# Patient Record
Sex: Male | Born: 1990 | Race: White | Hispanic: No | Marital: Single | Smoking: Current some day smoker
Health system: Southern US, Community
[De-identification: ages and names within clinical notes are randomized; demographics above are authoritative.]

## PROBLEM LIST (undated history)

## (undated) DIAGNOSIS — F909 Attention-deficit hyperactivity disorder, unspecified type: Secondary | ICD-10-CM

## (undated) DIAGNOSIS — F1920 Other psychoactive substance dependence, uncomplicated: Secondary | ICD-10-CM

---
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
## 2013-04-24 ENCOUNTER — Emergency Department (INDEPENDENT_AMBULATORY_CARE_PROVIDER_SITE_OTHER)
Admission: EM | Admit: 2013-04-24 | Discharge: 2013-04-24 | Disposition: A | Discharge status: Home / Self Care | Payer: Medicaid Other | Source: Home / Self Care | Attending: Emergency Medicine | Admitting: Emergency Medicine

## 2013-04-24 ENCOUNTER — Encounter (HOSPITAL_COMMUNITY): Payer: Self-pay | Admitting: Emergency Medicine

## 2013-04-24 DIAGNOSIS — J069 Acute upper respiratory infection, unspecified: Secondary | ICD-10-CM

## 2013-04-24 MED ORDER — TRAMADOL HCL 50 MG PO TABS: ORAL_TABLET | ORAL | Status: DC

## 2013-04-24 MED ORDER — ONDANSETRON 8 MG PO TBDP: 8.0000 mg | ORAL_TABLET | Freq: Three times a day (TID) | ORAL | Status: DC | PRN

## 2013-04-24 NOTE — Discharge Instructions (Signed)
Most upper respiratory infections are caused by viruses and do not require antibiotics.  We try to save the antibiotics for when we really need them to prevent bacteria from developing resistance to them.  Here are a few hints about things that can be done at home to help get over an upper respiratory infection quicker: ° °Get extra sleep and extra fluids.  Get 7 to 9 hours of sleep per night and 6 to 8 glasses of water a day.  Getting extra sleep keeps the immune system from getting run down.  Most people with an upper respiratory infection are a little dehydrated.  The extra fluids also keep the secretions liquified and easier to deal with.  Also, get extra vitamin C.  4000 mg per day is the recommended dose. °For the aches, headache, and fever, acetaminophen or ibuprofen are helpful.  These can be alternated every 4 hours.  People with liver disease should avoid large amounts of acetaminophen, and people with ulcer disease, gastroesophageal reflux, gastritis, congestive heart failure, chronic kidney disease, coronary artery disease and the elderly should avoid ibuprofen. °For nasal congestion try Mucinex-D, or if you're having lots of sneezing or clear nasal drainage use Zyrtec-D. People with high blood pressure can take these if their blood pressure is controlled, if not, it's best to avoid the forms with a "D" (decongestants).  You can use the plain Mucinex, Allegra, Claritin, or Zyrtec even if your blood pressure is not controlled.   °A Saline nasal spray such as Ocean Spray can also help.  You can add a decongestant sprays such as Afrin, but you should not use the decongestant sprays for more than 3 or 4 days since they can be habituating.  Breathe Rite nasal strips can also offer a non-drug alternative treatment to nasal congestion, especially at night. °For people with symptoms of sinusitis, sleeping with your head elevated can be helpful.  For sinus pain, moist, hot compresses to the face may provide some  relief.  Many people find that inhaling steam as in a shower or from a pot of steaming water can help. °For any viral infection, zinc containing lozenges such as Cold-Eze or Zicam are helpful.  Zinc helps to fight viral infection.  Hot salt water gargles (8 oz of hot water, 1/2 tsp of table salt, and a pinch of baking soda) can give relief as well as hot beverages such as hot tea.  Sucrets extra strength lozenges will help the sore throat.  °For the cough, take Delsym 2 tsp every 12 hours.  It has also been found recently that Aleve can help control a cough.  The dose is 1 to 2 tablets twice daily with food.  This can be combined with Delsym. (Note, if you are taking ibuprofen, you should not take Aleve as well--take one or the other.) °A cool mist vaporizer will help keep your mucous membranes from drying out.  ° °It's important when you have an upper respiratory infection not to pass the infection to others.  This involves being very careful about the following: ° °Frequent hand washing or use of hand sanitizer, especially after coughing, sneezing, blowing your nose or touching your face, nose or eyes. °Do not shake hands or touch anyone and try to avoid touching surfaces that other people use such as doorknobs, shopping carts, telephones and computer keyboards. °Use tissues and dispose of them properly in a garbage can or ziplock bag. °Cough into your sleeve. °Do not let others eat or   drink after you. ° °It's also important to recognize the signs of serious illness and get evaluated if they occur: °Any respiratory infection that lasts more than 7 to 10 days.  Yellow nasal drainage and sputum are not reliable indicators of a bacterial infection, but if they last for more than 1 week, see your doctor. °Fever and sore throat can indicate strep. °Fever and cough can indicate influenza or pneumonia. °Any kind of severe symptom such as difficulty breathing, intractable vomiting, or severe pain should prompt you to see  a doctor as soon as possible. ° ° °Your body's immune system is really the thing that will get rid of this infection.  Your immune system is comprised of 2 types of specialized cells called T cells and B cells.  T cells coordinate the array of cells in your body that engulf invading bacteria or viruses while B cells orchestrate the production of antibodies that neutralize infection.  Anything we do or any medications we give you, will just strengthen your immune system or help it clear up the infection quicker.  Here are a few helpful hints to improve your immune system to help overcome this illness or to prevent future infections: °· A few vitamins can improve the health of your immune system.  That's why your diet should include plenty of fruits, vegetables, fish, nuts, and whole grains. °· Vitamin A and bet-carotene can increase the cells that fight infections (T cells and B cells).  Vitamin A is abundant in dark greens and orange vegetables such as spinach, greens, sweet potatoes, and carrots. °· Vitamin B6 contributes to the maturation of white blood cells, the cells that fight disease.  Foods with vitamin B6 include cold cereal and bananas. °· Vitamin C is credited with preventing colds because it increases white blood cells and also prevents cellular damage.  Citrus fruits, peaches and green and red bell peppers are all hight in vitamin C. °· Vitamin E is an anti-oxidant that encourages the production of natural killer cells which reject foreign invaders and B cells that produce antibodies.  Foods high in vitamin E include wheat germ, nuts and seeds. °· Foods high in omega-3 fatty acids found in foods like salmon, tuna and mackerel boost your immune system and help cells to engulf and absorb germs. °· Probiotics are good bacteria that increase your T cells.  These can be found in yogurt and are available in supplements such as Culturelle or Align. °· Moderate exercise increases the strength of your immune  system and your ability to recover from illness.  I suggest 3 to 5 moderate intensity 30 minute workouts per week.   °· Sleep is another component of maintaining a strong immune system.  It enables your body to recuperate from the day's activities, stress and work.  My recommendation is to get between 7 and 9 hours of sleep per night. °· If you smoke, try to quit completely or at least cut down.  Drink alcohol only in moderation if at all.  No more than 2 drinks daily for men or 1 for women. °· Get a flu vaccine early in the fall or if you have not gotten one yet, once this illness has run its course.  If you are over 65, a smoker, or an asthmatic, get a pneumococcal vaccine. °· My final recommendation is to maintain a healthy weight.  Excess weight can impair the immune system by interfering with the way the immune system deals with invading viruses or   bacteria.    Cool Mist Vaporizers Vaporizers may help relieve the symptoms of a cough and cold. They add moisture to the air, which helps mucus to become thinner and less sticky. This makes it easier to breathe and cough up secretions. Cool mist vaporizers do not cause serious burns like hot mist vaporizers ("steamers, humidifiers"). Vaporizers have not been proved to show they help with colds. You should not use a vaporizer if you are allergic to mold.  HOME CARE INSTRUCTIONS  Follow the package instructions for the vaporizer.  Do not use anything other than distilled water in the vaporizer.  Do not run the vaporizer all of the time. This can cause mold or bacteria to grow in the vaporizer.  Clean the vaporizer after each time it is used.  Clean and dry the vaporizer well before storing it.  Stop using the vaporizer if worsening respiratory symptoms develop. Document Released: 11/25/2003 Document Revised: 10/30/2012 Document Reviewed: 07/17/2012 [facility] Patient Information 2014 [place], [place].

## 2013-04-24 NOTE — ED Provider Notes (Signed)
  Chief Complaint   Chief Complaint  Patient presents with  . Cough    History of Present Illness   Ronald Barajas is a 23 year old male who has had a three-day history of a cough productive of white sputum, aching in chest, feeling weak and tired. He's felt nauseated and vomited several times. He's had sore throat, and rhinorrhea. He denies any fever, chills, nasal congestion, headache, wheezing, stomach pain, or diarrhea. He has had no specific sick exposures. He's been taking Alka-Seltzer and NyQuil.  Review of Systems   Other than as noted above, the patient denies any of the following symptoms: Systemic:  No fevers, chills, sweats, or myalgias. Eye:  No redness or discharge. ENT:  No ear pain, headache, nasal congestion, drainage, sinus pressure, or sore throat. Neck:  No neck pain, stiffness, or swollen glands. Lungs:  No cough, sputum production, hemoptysis, wheezing, chest tightness, shortness of breath or chest pain. GI:  No abdominal pain, nausea, vomiting or diarrhea.  PMFSH   Past medical history, family history, social history, meds, and allergies were reviewed.   Physical exam   Vital signs:  BP 138/69  Pulse 82  Temp(Src) 98.2 F (36.8 C) (Oral)  Resp 18  SpO2 100% General:  Alert and oriented.  In no distress.  Skin warm and dry. Eye:  No conjunctival injection or drainage. Lids were normal. ENT:  TMs and canals were normal, without erythema or inflammation.  Nasal mucosa was clear and uncongested, without drainage.  Mucous membranes were moist.  Pharynx was clear with no exudate or drainage.  There were no oral ulcerations or lesions. Neck:  Supple, no adenopathy, tenderness or mass. Lungs:  No respiratory distress.  Lungs were clear to auscultation, without wheezes, rales or rhonchi.  Breath sounds were clear and equal bilaterally.  Heart:  Regular rhythm, without gallops, murmers or rubs. Skin:  Clear, warm, and dry, without rash or lesions.   Assessment      The encounter diagnosis was Viral upper respiratory infection.  Plan    1.  Meds:  The following meds were prescribed:   Discharge Medication List as of 04/24/2013  4:15 PM    START taking these medications   Details  ondansetron (ZOFRAN ODT) 8 MG disintegrating tablet Take 1 tablet (8 mg total) by mouth every 8 (eight) hours as needed for nausea., Starting 04/24/2013, Until Discontinued, Normal    traMADol (ULTRAM) 50 MG tablet 1 to 2 every 8 hours as needed for cough, Normal        2.  Patient Education/Counseling:  The patient was given appropriate handouts, self care instructions, and instructed in symptomatic relief.  Instructed to get extra fluids, rest, and use a cool mist vaporizer.    3.  Follow up:  The patient was told to follow up here if no better in 3 to 4 days, or sooner if becoming worse in any way, and given some red flag symptoms such as increasing fever, difficulty breathing, chest pain, or persistent vomiting which would prompt immediate return.  Follow up here as needed.      Reuben Likesavid C Tanvir Hipple, MD 04/24/13 Rickey [id number]

## 2013-04-24 NOTE — ED Notes (Signed)
Cough  Weak  Nauseated  X  3  Days   Pt      denys  Any  Other  Other  symptms

## 2013-05-15 ENCOUNTER — Encounter (HOSPITAL_COMMUNITY): Payer: Self-pay | Admitting: Emergency Medicine

## 2013-05-15 ENCOUNTER — Emergency Department (HOSPITAL_COMMUNITY)
Admission: EM | Admit: 2013-05-15 | Discharge: 2013-05-16 | Disposition: A | Discharge status: Home / Self Care | Payer: Medicaid Other | Attending: Emergency Medicine | Admitting: Emergency Medicine

## 2013-05-15 DIAGNOSIS — F17200 Nicotine dependence, unspecified, uncomplicated: Secondary | ICD-10-CM | POA: Insufficient documentation

## 2013-05-15 DIAGNOSIS — R05 Cough: Secondary | ICD-10-CM | POA: Insufficient documentation

## 2013-05-15 DIAGNOSIS — R748 Abnormal levels of other serum enzymes: Secondary | ICD-10-CM | POA: Insufficient documentation

## 2013-05-15 DIAGNOSIS — Z791 Long term (current) use of non-steroidal anti-inflammatories (NSAID): Secondary | ICD-10-CM | POA: Insufficient documentation

## 2013-05-15 DIAGNOSIS — Z79899 Other long term (current) drug therapy: Secondary | ICD-10-CM | POA: Insufficient documentation

## 2013-05-15 DIAGNOSIS — R059 Cough, unspecified: Secondary | ICD-10-CM | POA: Insufficient documentation

## 2013-05-15 DIAGNOSIS — R091 Pleurisy: Secondary | ICD-10-CM | POA: Insufficient documentation

## 2013-05-15 DIAGNOSIS — Z8659 Personal history of other mental and behavioral disorders: Secondary | ICD-10-CM | POA: Insufficient documentation

## 2013-05-15 NOTE — ED Notes (Signed)
Pleurisy: inc. Pain with abduction, deep inspiration. Non productive cough.

## 2013-05-16 ENCOUNTER — Emergency Department (HOSPITAL_COMMUNITY): Payer: Medicaid Other

## 2013-05-16 LAB — COMPREHENSIVE METABOLIC PANEL
ALBUMIN: 4.5 g/dL (ref 3.5–5.2)
ALK PHOS: 81 U/L (ref 39–117)
ALT: 694 U/L — ABNORMAL HIGH (ref 0–53)
AST: 310 U/L — AB (ref 0–37)
BUN: 9 mg/dL (ref 6–23)
CHLORIDE: 102 meq/L (ref 96–112)
CO2: 27 mEq/L (ref 19–32)
Calcium: 9.6 mg/dL (ref 8.4–10.5)
Creatinine, Ser: 0.86 mg/dL (ref 0.50–1.35)
GFR calc Af Amer: >90 mL/min (ref >90)
GFR calc non Af Amer: >90 mL/min (ref >90)
Glucose, Bld: 93 mg/dL (ref 70–99)
POTASSIUM: 4.3 meq/L (ref 3.7–5.3)
SODIUM: 141 meq/L (ref 137–147)
Total Bilirubin: 0.5 mg/dL (ref 0.3–1.2)
Total Protein: 7 g/dL (ref 6.0–8.3)

## 2013-05-16 LAB — CBC WITH DIFFERENTIAL/PLATELET
BASOS ABS: 0 10*3/uL (ref 0.0–0.1)
BASOS PCT: 0 % (ref 0–1)
Eosinophils Absolute: 0 10*3/uL (ref 0.0–0.7)
Eosinophils Relative: 1 % (ref 0–5)
HCT: 43.7 % (ref 39.0–52.0)
HEMOGLOBIN: 15.7 g/dL (ref 13.0–17.0)
Lymphocytes Relative: 34 % (ref 12–46)
Lymphs Abs: 2 10*3/uL (ref 0.7–4.0)
MCH: 32.4 pg (ref 26.0–34.0)
MCHC: 35.9 g/dL (ref 30.0–36.0)
MCV: 90.1 fL (ref 78.0–100.0)
MONOS PCT: 9 % (ref 3–12)
Monocytes Absolute: 0.5 10*3/uL (ref 0.1–1.0)
NEUTROS ABS: 3.2 10*3/uL (ref 1.7–7.7)
NEUTROS PCT: 56 % (ref 43–77)
PLATELETS: 196 10*3/uL (ref 150–400)
RBC: 4.85 MIL/uL (ref 4.22–5.81)
RDW: 12.4 % (ref 11.5–15.5)
WBC: 5.7 10*3/uL (ref 4.0–10.5)

## 2013-05-16 LAB — I-STAT TROPONIN, ED: Troponin i, poc: 0 ng/mL (ref 0.00–0.08)

## 2013-05-16 LAB — D-DIMER, QUANTITATIVE: D-Dimer, Quant: <0.27 ug/mL-FEU (ref 0.00–0.48)

## 2013-05-16 MED ORDER — KETOROLAC TROMETHAMINE 60 MG/2ML IM SOLN
60.0000 mg | Freq: Once | INTRAMUSCULAR | Status: AC
  Administered 2013-05-16: 60 mg via INTRAMUSCULAR
  Filled 2013-05-16: qty 2

## 2013-05-16 MED ORDER — IBUPROFEN 600 MG PO TABS: 600.0000 mg | ORAL_TABLET | Freq: Three times a day (TID) | ORAL | Status: AC

## 2013-05-16 NOTE — ED Notes (Signed)
Observing patient for 20 minutes to ensure pt does not have a medication reaction

## 2013-05-16 NOTE — Discharge Instructions (Signed)
Take medications as prescribed.: Make an appointment to followup with gastroenterologist regarding your liver abnormalities. Return to emergency department for worsening pain or any concerns.  Pleurisy Pleurisy is an inflammation and swelling of the lining of the lungs (pleura). Because of this inflammation, it hurts to breathe. It can be aggravated by coughing, laughing, or deep breathing. Pleurisy is often caused by an underlying infection or disease.  HOME CARE INSTRUCTIONS  Monitor your pleurisy for any changes. The following actions may help to alleviate any discomfort you are experiencing:  Medicine may help with pain. Only take over-the-counter or prescription medicines for pain, discomfort, or fever as directed by your health care provider.  Only take antibiotic medicine as directed. Make sure to finish it even if you start to feel better. SEEK MEDICAL CARE IF:   Your pain is not controlled with medicine or is increasing.  You have an increase in pus-like (purulent) secretions brought up with coughing. SEEK IMMEDIATE MEDICAL CARE IF:   You have blue or dark lips, fingernails, or toenails.  You are coughing up blood.  You have increased difficulty breathing.  You have continuing pain unrelieved by medicine or pain lasting more than 1 week.  You have pain that radiates into your neck, arms, or jaw.  You develop increased shortness of breath or wheezing.  You develop a fever, rash, vomiting, fainting, or other serious symptoms. MAKE SURE YOU:  Understand these instructions.   Will watch your condition.   Will get help right away if you are not doing well or get worse.  Document Released: 02/27/2005 Document Revised: 10/30/2012 Document Reviewed: 08/11/2012 [facility]Exit[facility] Patient Information 2014 [place] BeachExitCare, [place].

## 2013-05-16 NOTE — ED Provider Notes (Addendum)
CSN: [id number]     Arrival date & time 05/15/13  2350 History   First MD Initiated Contact with Patient 05/16/13 0002     Chief Complaint  Patient presents with  . Pleurisy     (Consider location/radiation/quality/duration/timing/severity/associated sxs/prior Treatment) HPI Patient presents with left-sided chest pain that is worse with deep inspiration and movement. A single and on for one week. He states was gradual onset. He has no shortness of breath but does continue to have a mild nonproductive cough from recent URI. He denies fevers or chills. He states the pain is constant but then has sharp stabbing pain. He has no abdominal pain, nausea or vomiting. He denies any lower extremity swelling or pain. He has no recent immobilization from extended travel or surgeries. No family history of thromboembolic disease. Patient admits to use of "ice" today. Past Medical History  Diagnosis Date  . ADHD (attention deficit hyperactivity disorder)    History reviewed. No pertinent past surgical history. History reviewed. No pertinent family history. History  Substance Use Topics  . Smoking status: Current Some Day Smoker    Types: Cigarettes  . Smokeless tobacco: Never Used  . Alcohol Use: Yes     Comment: pt states that he drank an unknown amount of beer yesterday, denies intake today.    Review of Systems  Constitutional: Negative for fever and chills.  HENT: Negative for congestion and sore throat.   Respiratory: Positive for cough. Negative for choking, shortness of breath and wheezing.   Cardiovascular: Negative for chest pain.  Gastrointestinal: Negative for nausea, vomiting, abdominal pain and diarrhea.  Musculoskeletal: Negative for back pain, neck pain and neck stiffness.  Skin: Negative for rash and wound.  Neurological: Negative for dizziness, syncope, weakness, light-headedness, numbness and headaches.  All other systems reviewed and are negative.      Allergies  Review  of patient's allergies indicates no known allergies.  Home Medications   Current Outpatient Rx  Name  Route  Sig  Dispense  Refill  . FLUoxetine (PROZAC) 20 MG capsule   Oral   Take 20 mg by mouth daily.         . QUEtiapine (SEROQUEL) 200 MG tablet   Oral   Take 200 mg by mouth at bedtime.         Marland Kitchen ibuprofen (ADVIL,MOTRIN) 600 MG tablet   Oral   Take 1 tablet (600 mg total) by mouth 3 (three) times daily after meals.   30 tablet   0    BP 123/71  Pulse 95  Temp(Src) 98.1 F (36.7 C) (Oral)  Resp 15  Ht 5\' 7"  (1.702 m)  Wt 190 lb (86.183 kg)  BMI 29.75 kg/m2  SpO2 100% Physical Exam  Nursing note and vitals reviewed. Constitutional: He is oriented to person, place, and time. He appears well-developed and well-nourished. No distress.  HENT:  Head: Normocephalic and atraumatic.  Mouth/Throat: Oropharynx is clear and moist. No oropharyngeal exudate.  Eyes: EOM are normal. Pupils are equal, round, and reactive to light.  Neck: Normal range of motion. Neck supple.  Cardiovascular: Normal rate and regular rhythm.  Exam reveals no gallop and no friction rub.   No murmur heard. Pulmonary/Chest: Effort normal and breath sounds normal. No respiratory distress. He has no wheezes. He has no rales. He exhibits no tenderness.  Abdominal: Soft. Bowel sounds are normal. He exhibits no distension and no mass. There is no tenderness. There is no rebound and no guarding.  Musculoskeletal: Normal  range of motion. He exhibits no edema and no tenderness.  No calf swelling or tenderness.  Neurological: He is alert and oriented to person, place, and time.  Patient is alert and oriented x3 with clear, goal oriented speech. Patient has 5/5 motor in all extremities. Sensation is intact to light touch.   Skin: Skin is warm and dry. No rash noted. No erythema.  Psychiatric: He has a normal mood and affect. His behavior is normal.    ED Course  Procedures (including critical care  time) Labs Review Labs Reviewed - No data to display Imaging Review No results found.   EKG Interpretation None      MDM   Final diagnoses:  Pleurisy    Patient with a normal chest x-ray and EKG. I have very low suspicion for pulmonary embolism. 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 Given the recent URI his pain likely represents pleurisy. Will start on nonsteroidal anti-inflammatory medication and advised to abstain from all illicit drugs. Return precautions have been given and the patient has voiced understanding.    Loren Raceravid Anahis Furgeson, MD 05/16/13 [phone]  Further review of the patient's EKG shows a S1Q3T3 pattern. The most recent EKG I have comparison is from 2011 does not appear to have this pattern. I still have very low suspicion for pulmonary embolism will get basic blood work and d-dimer to rule out.  D-dimer is normal. Troponin is normal. Patient in stable emergency department. He has elevated liver enzymes. 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 He does not have a gastroenterologist and advised he followup with our  gastroenterologist on call.   Loren Raceravid Pamla Pangle, MD 05/16/13 [phone]

## 2013-06-03 ENCOUNTER — Ambulatory Visit: Payer: Medicaid Other

## 2014-11-26 ENCOUNTER — Encounter (HOSPITAL_COMMUNITY): Payer: Self-pay | Admitting: Emergency Medicine

## 2014-11-26 ENCOUNTER — Emergency Department (HOSPITAL_COMMUNITY)
Admission: EM | Admit: 2014-11-26 | Discharge: 2014-11-27 | Disposition: A | Discharge status: Home / Self Care | Payer: Medicaid Other | Attending: Emergency Medicine | Admitting: Emergency Medicine

## 2014-11-26 DIAGNOSIS — R21 Rash and other nonspecific skin eruption: Secondary | ICD-10-CM | POA: Insufficient documentation

## 2014-11-26 DIAGNOSIS — Z720 Tobacco use: Secondary | ICD-10-CM | POA: Insufficient documentation

## 2014-11-26 DIAGNOSIS — Z8659 Personal history of other mental and behavioral disorders: Secondary | ICD-10-CM | POA: Insufficient documentation

## 2014-11-26 DIAGNOSIS — Z79899 Other long term (current) drug therapy: Secondary | ICD-10-CM | POA: Insufficient documentation

## 2014-11-26 DIAGNOSIS — Z791 Long term (current) use of non-steroidal anti-inflammatories (NSAID): Secondary | ICD-10-CM | POA: Insufficient documentation

## 2014-11-26 HISTORY — DX: Other psychoactive substance dependence, uncomplicated: F19.20

## 2014-11-26 NOTE — ED Provider Notes (Signed)
CSN: [id number]     Arrival date & time 11/26/14  2330 History  This chart was scribed for Ronald Night, PA-C, working with Loren Racer, MD by Ronald Barajas, ED Scribe. This patient was seen in room TR08C/TR08C and the patient's care was started at 12:04 AM.   Chief Complaint  Patient presents with  . Insect Bite  . Leg Pain   The history is provided by the patient. No language interpreter was used.   HPI Comments: Ronald Barajas is a 24 y.o. male who presents to the Emergency Department complaining of a spreading, itching, moderately painful rash on the bilateral anterior thighs onset 3-4 days ago, no tx's tried.  He does not recall any insect bite.     Past Medical History  Diagnosis Date  . ADHD (attention deficit hyperactivity disorder)   . Drug addiction    History reviewed. No pertinent past surgical history. No family history on file. Social History  Substance Use Topics  . Smoking status: Current Some Day Smoker    Types: Cigarettes  . Smokeless tobacco: Never Used  . Alcohol Use: Yes    Review of Systems A complete 10 system review of systems was obtained and all systems are negative except as noted in the HPI and PMH.   Allergies  Review of patient's allergies indicates no known allergies.  Home Medications   Prior to Admission medications   Medication Sig Start Date End Date Taking? Authorizing Provider  FLUoxetine (PROZAC) 20 MG capsule Take 20 mg by mouth daily.    Historical Provider, MD  ibuprofen (ADVIL,MOTRIN) 600 MG tablet Take 1 tablet (600 mg total) by mouth 3 (three) times daily after meals. 05/16/13   Loren Racer, MD  QUEtiapine (SEROQUEL) 200 MG tablet Take 200 mg by mouth at bedtime.    Historical Provider, MD   BP 119/64 mmHg  Pulse 89  Temp(Src) 97.6 F (36.4 C) (Oral)  Resp 16  Ht  (1.702 m)  Wt 190 lb (86.183 kg)  BMI 29.75 kg/m2  SpO2 99% Physical Exam  Constitutional: He is oriented to person, place, and time. He  appears well-developed and well-nourished. No distress.  HENT:  Head: Normocephalic and atraumatic.  Mouth/Throat: Oropharynx is clear and moist.  Eyes: Pupils are equal, round, and reactive to light.  Musculoskeletal: Normal range of motion.  Neurological: He is alert and oriented to person, place, and time.  Skin: Skin is warm and dry. Rash noted.  Patient has a diffuse rash on his abdomen.  He also has a diffuse rash on both legs that has a different appearance rash on his abdomen is more fine.  He has vesicular type rash noted to the feet with some surrounding redness in the feet  Psychiatric: He has a normal mood and affect. His behavior is normal.  Nursing note and vitals reviewed.   ED Course  Procedures (including critical care time)  DIAGNOSTIC STUDIES: Oxygen Saturation is 99% on RA, normal by my interpretation.    COORDINATION OF CARE:    I have personally reviewed and evaluated these images and lab results as part of my medical decision-making.  Patient be referred to dermatology and advised to return here as needed.  Patient also had an RPR sent to further evaluate to ensure there was no other causes  Zaiyden Strozier, PA-C 11/27/14 0118  Loren Racer, MD 11/27/14 [phone]

## 2014-11-26 NOTE — ED Notes (Signed)
Pt. reports multiple insect bites at both legs with pain for several day , denies injury , ambulatory / denies fever .

## 2014-11-27 LAB — RPR: RPR Ser Ql: NONREACTIVE

## 2014-11-27 MED ORDER — DEXAMETHASONE SODIUM PHOSPHATE 10 MG/ML IJ SOLN
8.0000 mg | Freq: Once | INTRAMUSCULAR | Status: AC
  Administered 2014-11-27: 8 mg via INTRAMUSCULAR
  Filled 2014-11-27: qty 1

## 2014-11-27 MED ORDER — PREDNISONE 50 MG PO TABS: 50.0000 mg | ORAL_TABLET | Freq: Every day | ORAL | Status: AC

## 2014-11-27 NOTE — ED Notes (Signed)
Pt stable, ambulatory, states understanding of discharge instructions 

## 2014-11-27 NOTE — Discharge Instructions (Signed)
Return here as needed.  Follow-up with a dermatologist Benadryl along with the prednisone
